# Patient Record
Sex: Female | Born: 1990 | Race: Black or African American | Hispanic: No | Marital: Single | Smoking: Never smoker
Health system: Southern US, Community
[De-identification: ages and names within clinical notes are randomized; demographics above are authoritative.]

## PROBLEM LIST (undated history)

## (undated) DIAGNOSIS — J45909 Unspecified asthma, uncomplicated: Secondary | ICD-10-CM

## (undated) HISTORY — PX: TONSILLECTOMY: SUR1361

## (undated) HISTORY — PX: ADENOIDECTOMY: SUR15

---

## 2011-09-02 ENCOUNTER — Emergency Department (HOSPITAL_COMMUNITY)
Admission: EM | Admit: 2011-09-02 | Discharge: 2011-09-02 | Disposition: A | Discharge status: Home / Self Care | Payer: Self-pay | Attending: Emergency Medicine | Admitting: Emergency Medicine

## 2011-09-02 ENCOUNTER — Emergency Department (HOSPITAL_COMMUNITY): Payer: Self-pay

## 2011-09-02 DIAGNOSIS — M25579 Pain in unspecified ankle and joints of unspecified foot: Secondary | ICD-10-CM | POA: Insufficient documentation

## 2011-09-02 DIAGNOSIS — S93409A Sprain of unspecified ligament of unspecified ankle, initial encounter: Secondary | ICD-10-CM | POA: Insufficient documentation

## 2011-09-02 DIAGNOSIS — M25673 Stiffness of unspecified ankle, not elsewhere classified: Secondary | ICD-10-CM | POA: Insufficient documentation

## 2011-09-02 DIAGNOSIS — X500XXA Overexertion from strenuous movement or load, initial encounter: Secondary | ICD-10-CM | POA: Insufficient documentation

## 2011-09-02 DIAGNOSIS — J45909 Unspecified asthma, uncomplicated: Secondary | ICD-10-CM | POA: Insufficient documentation

## 2011-09-02 DIAGNOSIS — R262 Difficulty in walking, not elsewhere classified: Secondary | ICD-10-CM | POA: Insufficient documentation

## 2011-09-02 DIAGNOSIS — M25476 Effusion, unspecified foot: Secondary | ICD-10-CM | POA: Insufficient documentation

## 2011-09-02 DIAGNOSIS — M25676 Stiffness of unspecified foot, not elsewhere classified: Secondary | ICD-10-CM | POA: Insufficient documentation

## 2011-09-02 DIAGNOSIS — M25473 Effusion, unspecified ankle: Secondary | ICD-10-CM | POA: Insufficient documentation

## 2014-07-11 ENCOUNTER — Emergency Department (HOSPITAL_COMMUNITY)
Admission: EM | Admit: 2014-07-11 | Discharge: 2014-07-11 | Disposition: A | Discharge status: Home / Self Care | Payer: No Typology Code available for payment source | Attending: Emergency Medicine | Admitting: Emergency Medicine

## 2014-07-11 ENCOUNTER — Encounter (HOSPITAL_COMMUNITY): Payer: Self-pay | Admitting: Emergency Medicine

## 2014-07-11 DIAGNOSIS — R059 Cough, unspecified: Secondary | ICD-10-CM | POA: Diagnosis present

## 2014-07-11 DIAGNOSIS — R05 Cough: Secondary | ICD-10-CM | POA: Insufficient documentation

## 2014-07-11 DIAGNOSIS — J45909 Unspecified asthma, uncomplicated: Secondary | ICD-10-CM | POA: Insufficient documentation

## 2014-07-11 DIAGNOSIS — J3489 Other specified disorders of nose and nasal sinuses: Secondary | ICD-10-CM | POA: Insufficient documentation

## 2014-07-11 HISTORY — DX: Unspecified asthma, uncomplicated: J45.909

## 2014-07-11 MED ORDER — CETIRIZINE-PSEUDOEPHEDRINE ER 5-120 MG PO TB12: 1.0000 | ORAL_TABLET | Freq: Two times a day (BID) | ORAL | Status: AC

## 2014-07-11 MED ORDER — ONDANSETRON 8 MG PO TBDP
8.0000 mg | ORAL_TABLET | Freq: Once | ORAL | Status: AC
  Administered 2014-07-11: 8 mg via ORAL
  Filled 2014-07-11: qty 1

## 2014-07-11 MED ORDER — GUAIFENESIN ER 600 MG PO TB12: 600.0000 mg | ORAL_TABLET | Freq: Two times a day (BID) | ORAL | Status: AC
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
## 2014-07-11 NOTE — ED Notes (Signed)
Pt reports getting over a cold, states severe h/a and nausea.  Vomited x 2 days ago with decreased appetite.

## 2014-07-11 NOTE — Discharge Instructions (Signed)
Please use the medications as recommended and followup with a primary care provider or school health clinic.     Cough, Adult  A cough is a reflex that helps clear your throat and airways. It can help heal the body or may be a reaction to an irritated airway. A cough may only last 2 or 3 weeks (acute) or may last more than 8 weeks (chronic).  CAUSES Acute cough:  Viral or bacterial infections. Chronic cough:  Infections.  Allergies.  Asthma.  Post-nasal drip.  Smoking.  Heartburn or acid reflux.  Some medicines.  Chronic lung problems (COPD).  Cancer. SYMPTOMS   Cough.  Fever.  Chest pain.  Increased breathing rate.  High-pitched whistling sound when breathing (wheezing).  Colored mucus that you cough up (sputum). TREATMENT   A bacterial cough may be treated with antibiotic medicine.  A viral cough must run its course and will not respond to antibiotics.  Your caregiver may recommend other treatments if you have a chronic cough. HOME CARE INSTRUCTIONS   Only take over-the-counter or prescription medicines for pain, discomfort, or fever as directed by your caregiver. Use cough suppressants only as directed by your caregiver.  Use a cold steam vaporizer or humidifier in your bedroom or home to help loosen secretions.  Sleep in a semi-upright position if your cough is worse at night.  Rest as needed.  Stop smoking if you smoke. SEEK IMMEDIATE MEDICAL CARE IF:   You have pus in your sputum.  Your cough starts to worsen.  You cannot control your cough with suppressants and are losing sleep.  You begin coughing up blood.  You have difficulty breathing.  You develop pain which is getting worse or is uncontrolled with medicine.  You have a fever. MAKE SURE YOU:   Understand these instructions.  Will watch your condition.  Will get help right away if you are not doing well or get worse. Document Released: 04/23/2011 Document Revised:  01/17/2012 Document Reviewed: 04/23/2011 [facility] Patient Information 2015 [place], [place]. This information is not intended to replace advice given to you by your health care provider. Make sure you discuss any questions you have with your health care provider.
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
## 2014-07-14 NOTE — ED Provider Notes (Signed)
Medical screening examination/treatment/procedure(s) were performed by non-physician practitioner and as supervising physician I was immediately available for consultation/collaboration.   EKG Interpretation None       Zachery Niswander T Lile Mccurley, MD 07/14/14 0926 

## 2015-08-26 ENCOUNTER — Emergency Department (HOSPITAL_COMMUNITY)
Admission: EM | Admit: 2015-08-26 | Discharge: 2015-08-26 | Disposition: A | Discharge status: Home / Self Care | Payer: Managed Care, Other (non HMO) | Attending: Emergency Medicine | Admitting: Emergency Medicine

## 2015-08-26 ENCOUNTER — Emergency Department (HOSPITAL_COMMUNITY): Payer: Managed Care, Other (non HMO)

## 2015-08-26 ENCOUNTER — Encounter (HOSPITAL_COMMUNITY): Payer: Self-pay

## 2015-08-26 DIAGNOSIS — S4992XA Unspecified injury of left shoulder and upper arm, initial encounter: Secondary | ICD-10-CM | POA: Diagnosis present

## 2015-08-26 DIAGNOSIS — Y998 Other external cause status: Secondary | ICD-10-CM | POA: Diagnosis not present

## 2015-08-26 DIAGNOSIS — S43422A Sprain of left rotator cuff capsule, initial encounter: Secondary | ICD-10-CM | POA: Diagnosis not present

## 2015-08-26 DIAGNOSIS — J45909 Unspecified asthma, uncomplicated: Secondary | ICD-10-CM | POA: Diagnosis not present

## 2015-08-26 DIAGNOSIS — Y92241 Library as the place of occurrence of the external cause: Secondary | ICD-10-CM | POA: Diagnosis not present

## 2015-08-26 DIAGNOSIS — Z79899 Other long term (current) drug therapy: Secondary | ICD-10-CM | POA: Insufficient documentation

## 2015-08-26 DIAGNOSIS — Y9389 Activity, other specified: Secondary | ICD-10-CM | POA: Diagnosis not present

## 2015-08-26 MED ORDER — ACETAMINOPHEN 500 MG PO TABS
1000.0000 mg | ORAL_TABLET | Freq: Once | ORAL | Status: AC
  Administered 2015-08-26: 1000 mg via ORAL
  Filled 2015-08-26: qty 2

## 2015-08-26 MED ORDER — IBUPROFEN 200 MG PO TABS
400.0000 mg | ORAL_TABLET | Freq: Once | ORAL | Status: AC
  Administered 2015-08-26: 400 mg via ORAL
  Filled 2015-08-26: qty 2

## 2015-08-26 MED ORDER — IBUPROFEN 800 MG PO TABS: 800.0000 mg | ORAL_TABLET | Freq: Three times a day (TID) | ORAL | Status: AC
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
## 2015-08-26 NOTE — Discharge Instructions (Signed)
Shoulder Sprain °A shoulder sprain is a partial or complete tear in one of the tough, fiber-like tissues (ligaments) in the shoulder. The ligaments in the shoulder help to hold the shoulder in place. °CAUSES °This condition may be caused by: °· A fall. °· A hit to the shoulder. °· A twist of the arm. °RISK FACTORS °This condition is more likely to develop in: °· People who play sports. °· People who have problems with balance or coordination. °SYMPTOMS °Symptoms of this condition include: °· Pain when moving the shoulder. °· Limited ability to move the shoulder. °· Swelling and tenderness on top of the shoulder. °· Warmth in the shoulder. °· A change in the shape of the shoulder. °· Redness or bruising on the shoulder. °DIAGNOSIS °This condition is diagnosed with a physical exam. During the exam, you may be asked to do simple exercises with your shoulder. You may also have imaging tests, such as X-rays, MRI, or a CT scan. These tests can show how severe the sprain is. °TREATMENT °This condition may be treated with: °· Rest. °· Pain medicine. °· Ice. °· A sling or brace. This is used to keep the arm still while the shoulder is healing. °· Physical therapy or rehabilitation exercises. These help to improve the range of motion and strength of the shoulder. °· Surgery (rare). Surgery may be needed if the sprain caused a joint to become unstable. Surgery may also be needed to reduce pain. °Some people may develop ongoing shoulder pain or lose some range of motion in the shoulder. However, most people do not develop long-term problems. °HOME CARE INSTRUCTIONS °· Rest. °· Take over-the-counter and prescription medicines only as told by your health care provider. °· If directed, apply ice to the area: °¨ Put ice in a plastic bag. °¨ Place a towel between your skin and the bag. °¨ Leave the ice on for 20 minutes, 2-3 times per day. °· If you were given a shoulder sling or brace: °¨ Wear it as told. °¨ Remove it to shower or  bathe. °¨ Move your arm only as much as told by your health care provider, but keep your hand moving to prevent swelling. °· If you were shown how to do any exercises, do them as told by your health care provider. °· Keep all follow-up visits as told by your health care provider. This is important. °SEEK MEDICAL CARE IF: °· Your pain gets worse. °· Your pain is not relieved with medicines. °· You have increased redness or swelling. °SEEK IMMEDIATE MEDICAL CARE IF: °· You have a fever. °· You cannot move your arm or shoulder. °· You develop numbness or tingling in your arms, hands, or fingers. °  °This information is not intended to replace advice given to you by your health care provider. Make sure you discuss any questions you have with your health care provider. °  °Document Released: 03/13/2009 Document Revised: 07/16/2015 Document Reviewed: 02/17/2015 °Elsevier Interactive Patient Education ©2016 [employer] ° °

## 2015-09-09 ENCOUNTER — Emergency Department (HOSPITAL_COMMUNITY)
Admission: EM | Admit: 2015-09-09 | Discharge: 2015-09-10 | Disposition: A | Discharge status: Home / Self Care | Payer: Managed Care, Other (non HMO) | Attending: Emergency Medicine | Admitting: Emergency Medicine

## 2015-09-09 ENCOUNTER — Encounter (HOSPITAL_COMMUNITY): Payer: Self-pay | Admitting: Emergency Medicine

## 2015-09-09 DIAGNOSIS — M25511 Pain in right shoulder: Secondary | ICD-10-CM | POA: Insufficient documentation

## 2015-09-09 DIAGNOSIS — M25512 Pain in left shoulder: Secondary | ICD-10-CM | POA: Insufficient documentation

## 2015-09-09 DIAGNOSIS — Z791 Long term (current) use of non-steroidal anti-inflammatories (NSAID): Secondary | ICD-10-CM | POA: Diagnosis not present

## 2015-09-09 DIAGNOSIS — M542 Cervicalgia: Secondary | ICD-10-CM | POA: Diagnosis not present

## 2015-09-09 DIAGNOSIS — J45909 Unspecified asthma, uncomplicated: Secondary | ICD-10-CM | POA: Insufficient documentation
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
## 2015-09-10 MED ORDER — CYCLOBENZAPRINE HCL 10 MG PO TABS: 10.0000 mg | ORAL_TABLET | Freq: Two times a day (BID) | ORAL | Status: AC | PRN

## 2015-09-10 MED ORDER — MELOXICAM 15 MG PO TABS: 15.0000 mg | ORAL_TABLET | Freq: Every day | ORAL | Status: AC

## 2015-09-10 MED ORDER — CYCLOBENZAPRINE HCL 10 MG PO TABS
10.0000 mg | ORAL_TABLET | Freq: Once | ORAL | Status: AC
  Administered 2015-09-10: 10 mg via ORAL
  Filled 2015-09-10: qty 1

## 2015-09-10 MED ORDER — NAPROXEN 500 MG PO TABS
500.0000 mg | ORAL_TABLET | Freq: Once | ORAL | Status: AC
  Administered 2015-09-10: 500 mg via ORAL
  Filled 2015-09-10: qty 1

## 2015-09-10 NOTE — Discharge Instructions (Signed)
Take mobic as needed for pain. Take flexeril as needed for muscle spasm. You may take these medications together. Refer to attached documents for more information.  °
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
